# Patient Record
Sex: Male | Born: 2000 | Race: White | Hispanic: Yes | Marital: Single | Smoking: Never smoker
Health system: Southern US, Community
[De-identification: ages and names within clinical notes are randomized; demographics above are authoritative.]

---
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
## 2021-02-14 ENCOUNTER — Other Ambulatory Visit: Payer: Self-pay

## 2021-02-14 ENCOUNTER — Telehealth (INDEPENDENT_AMBULATORY_CARE_PROVIDER_SITE_OTHER): Payer: 59 | Admitting: Child and Adolescent Psychiatry

## 2021-02-14 DIAGNOSIS — F3341 Major depressive disorder, recurrent, in partial remission: Secondary | ICD-10-CM

## 2021-02-14 DIAGNOSIS — F418 Other specified anxiety disorders: Secondary | ICD-10-CM | POA: Diagnosis not present

## 2021-02-14 NOTE — Progress Notes (Signed)
Virtual Visit via Video Note  I connected with Adam Rodgers on 02/14/21 at  4:30 PM EST by a video enabled telemedicine application and verified that I am speaking with the correct person using two identifiers.  Location: Patient: home Provider: office   I discussed the limitations of evaluation and management by telemedicine and the availability of in person appointments. The patient expressed understanding and agreed to proceed.    I discussed the assessment and treatment plan with the patient. The patient was provided an opportunity to ask questions and all were answered. The patient agreed with the plan and demonstrated an understanding of the instructions.   The patient was advised to call back or seek an in-person evaluation if the symptoms worsen or if the condition fails to improve as anticipated.  I provided 17 minutes of non-face-to-face time during this encounter.   Orlene Erm, MD       [facility] MD/PA/NP OP Progress Note  02/14/2021 4:56 PM Adam Rodgers  MRN:  [id number]  Chief Complaint:   Medication management follow-up for ADHD, anxiety and depression.    HPI:  This is a 21 year old Hispanic male, 12th grader at [employer], domiciled with biological parents.  He has psychiatric history of ADHD, anxiety disorder, cannabis use disorder and depression.    He was seen and evaluated over telemedicine encounter for medication management follow-up.  He was accompanied with his mother at his home and was evaluated separately from his mother and jointly.  He reports that he is doing "fine".  He denies any problems with mood, denies feeling depressed.  Also denies problems with anxiety.  He reports that "everything is fine". He reports that he is doing well academically, has been very busy with the schoolwork and also continues to work at a [employer].  He reports that he is eating well, sleep has been good except recently.  He  reports that because of the Christmas break his sleep is out of routine and is trying to fix that.  He denies any suicidal thoughts or homicidal thoughts.  He initially reported that he has been compliant to his medications however when he was presented with the details that he has not picked up the prescription since August of last year he reports that he has not been taking it recently because he has been forgetting and focusing on his schoolwork.  He reports that he is planning to restart taking Zoloft 25 mg because it was helpful to him.  We discussed his hesitation with medication and he reports that he just forgets to take them.  We discussed the importance of adherence if he restarts taking Zoloft.  He verbalized understanding.  His mother denies any new concerns for today's appointment and reports that from her perspective Adam Rodgers has been doing really well.  Denies concerns regarding mood or anxiety.  She reports that he still struggles academically but has been more motivated to finish his high school on a good note.   Visit Diagnosis:    ICD-10-CM   1. Other specified anxiety disorders  F41.8     2. Recurrent major depressive disorder, in partial remission ([place])  F33.41         Past Psychiatric History: As mentioned in initial H&P, reviewed today, no change Past Medical History: No past medical history on file. No past surgical history on file.  Family Psychiatric History: As mentioned in initial H&P, reviewed today, no change   Family History:  No family history on file.  Social History:  Social History   Socioeconomic History   Marital status: Single    Spouse name: Not on file   Number of children: Not on file   Years of education: Not on file   Highest education level: Not on file  Occupational History   Not on file  Tobacco Use   Smoking status: Never   Smokeless tobacco: Never  Substance and Sexual Activity   Alcohol use: Not Currently   Drug use: Not  Currently   Sexual activity: Not on file  Other Topics Concern   Not on file  Social History Narrative   Not on file   Social Determinants of Health   Financial Resource Strain: Not on file  Food Insecurity: Not on file  Transportation Needs: Not on file  Physical Activity: Not on file  Stress: Not on file  Social Connections: Not on file    Allergies: No Known Allergies  Metabolic Disorder Labs: No results found for: HGBA1C, MPG No results found for: PROLACTIN Lab Results  Component Value Date   CHOL 168 09/26/2013   TRIG 76 09/26/2013   HDL 50 09/26/2013   VLDL 15 09/26/2013   LDLCALC 103 (H) 09/26/2013   LDLCALC 109 (H) 09/15/2011   No results found for: TSH  Therapeutic Level Labs: No results found for: LITHIUM No results found for: VALPROATE No components found for:  CBMZ  Current Medications: Current Outpatient Medications  Medication Sig Dispense Refill   hydrOXYzine (ATARAX/VISTARIL) 25 MG tablet Take 0.5-1 tablets (12.5-25 mg total) by mouth 3 (three) times daily as needed for anxiety. 30 tablet 0   sertraline (ZOLOFT) 25 MG tablet Take 1 tablet (25 mg total) by mouth daily. 30 tablet 1   traZODone (DESYREL) 50 MG tablet TAKE 1/2 TO 1 TABLET(25 TO 50 MG) BY MOUTH AT BEDTIME 90 tablet 0   No current facility-administered medications for this visit.     Musculoskeletal: Strength & Muscle Tone: unable to assess since visit was over the telemedicine. Gait & Station: unable to assess since visit was over the telemedicine. Patient leans: N/A  Psychiatric Specialty Exam: Review of Systems  There were no vitals taken for this visit.There is no height or weight on file to calculate BMI.   Mental Status Exam: Appearance: casually dressed; well groomed; no overt signs of trauma or distress noted Attitude: calm, cooperative with good eye contact Activity: No PMA/PMR, no tics/no tremors; no EPS noted  Speech: normal rate, rhythm and volume Thought Process:  Logical, linear, and goal-directed.  Associations: no looseness, tangentiality, circumstantiality, flight of ideas, thought blocking or word salad noted Thought Content: (abnormal/psychotic thoughts): no abnormal or delusional thought process evidenced SI/HI: denies Si/Hi Perception: no illusions or visual/auditory hallucinations noted; no response to internal stimuli demonstrated Mood & Affect: "good"/full range, neutral Judgment & Insight: both fair Attention and Concentration : Good Cognition : WNL Language : Good ADL - Intact   GAD-7    Flowsheet Row Counselor from 07/14/2019 in [facility]  Total GAD-7 Score 17      PHQ2-9    [place] from 07/14/2019 in [place]  PHQ-2 Total Score 6  PHQ-9 Total Score 19        Assessment and Plan:   21 year old male with hx of anxiety, ADHD,  Depression, Cannabis use disorder, presents today for follow up .  He continues to report overall stability with mood and anxiety despite nonadherence  to medication.  Some concerns regarding minimization of his symptoms.  Psychoeducation was provided in regards of medication treatment and he wants to restart taking Zoloft 25 mg once a day.  His mother denies any new concerns for today's appointment and denies concerns regarding mood or anxiety at this time.  Plan is mentioned below.    Plan: # Anxiety (chronic,stable) -Restart with Zoloft 25 mg once a day - Continue with Atarax 12.5-25 mg TID PRN for anxiety, has not been using it.  - He was previously given name of [email or web address] to look for therapist in community since they do not want to continue with Ms. Kandice Moos. He is not interested in therapy.    # Depression (recurrent and remission) - Same as mentioned above for anxiety   # ADHD (chronic and same) -Consider restarting Concerta if needed, however he is doing well with ADHD.    # Sleep(improved) - Not taking  Trazodone 25-50 mg QHS for sleep.    This note was generated in part or whole with voice recognition software. Voice recognition is usually quite accurate but there are transcription errors that can and very often do occur. I apologize for any typographical errors that were not detected and corrected.  MDM = 2 or more chronic stable conditions + med management        Orlene Erm, MD 02/14/2021, 4:56 PM

## 2021-02-17 IMAGING — US US SCROTUM W/ DOPPLER COMPLETE
1 series · 14 of 25 positions shown · non-contrast
Comparison: None.

CLINICAL DATA: Left testicular swelling for 2 days

EXAM:
SCROTAL ULTRASOUND
DOPPLER ULTRASOUND OF THE TESTICLES
TECHNIQUE: Complete ultrasound examination of the testicles, epididymis, and
other scrotal structures was performed. Color and spectral Doppler
ultrasound were also utilized to evaluate blood flow to the
testicles.

[Series 1: us scrotum w/doppler · 14 of 43 slices shown]
[im 1/43]
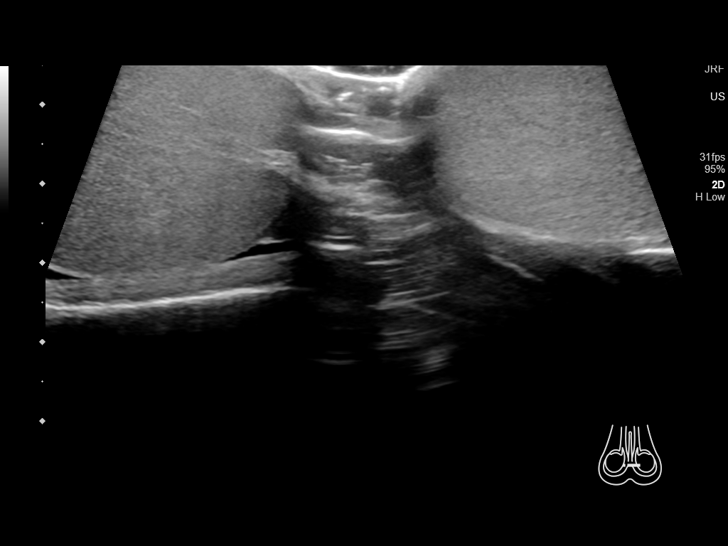
[im 4/43]
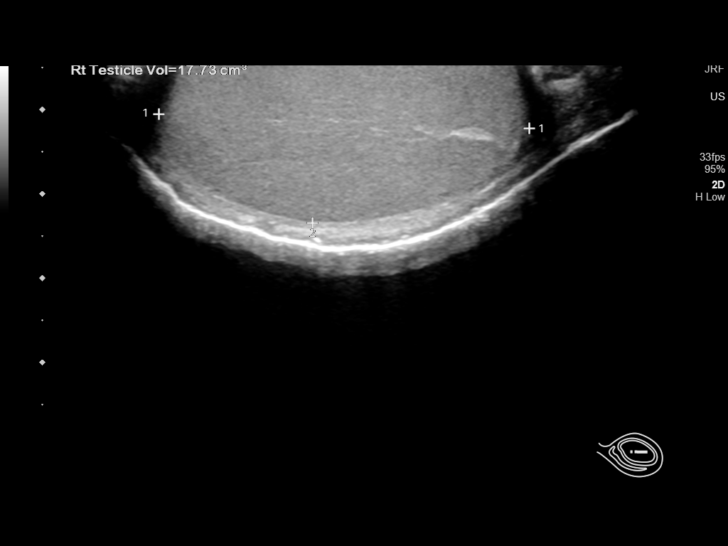
[im 8/43]
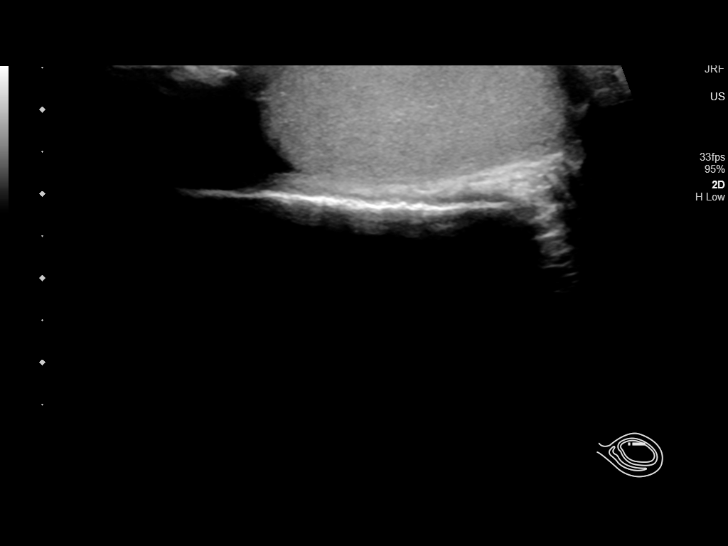
[im 11/43]
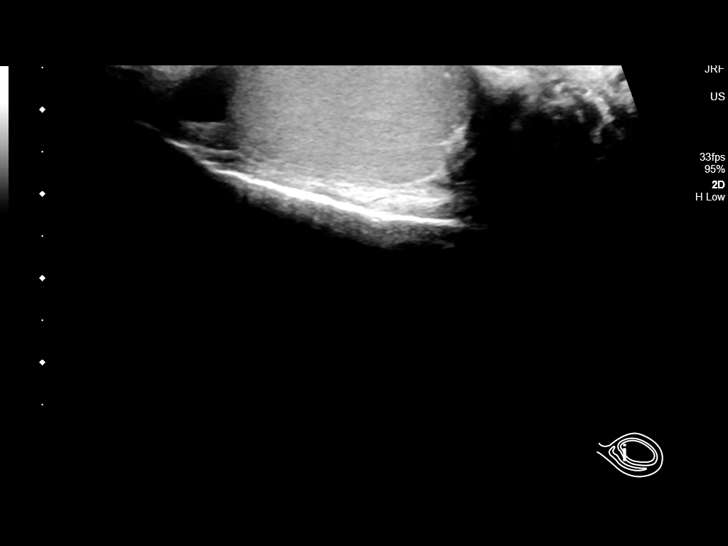
[im 15/43]
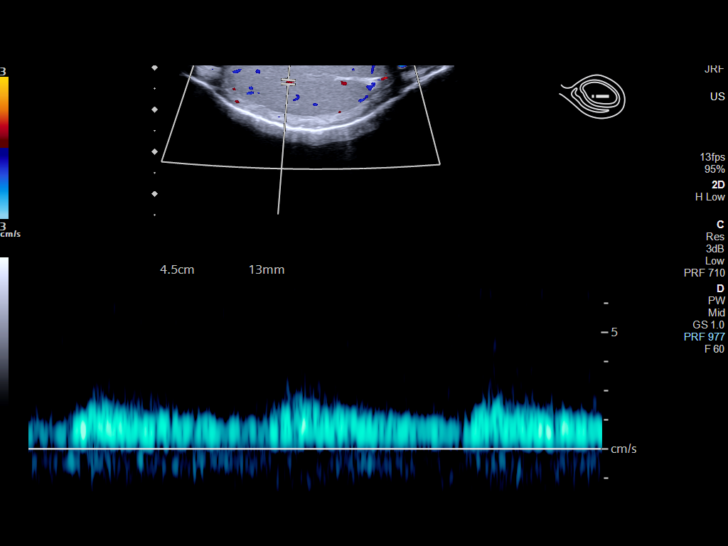
[im 16/43]
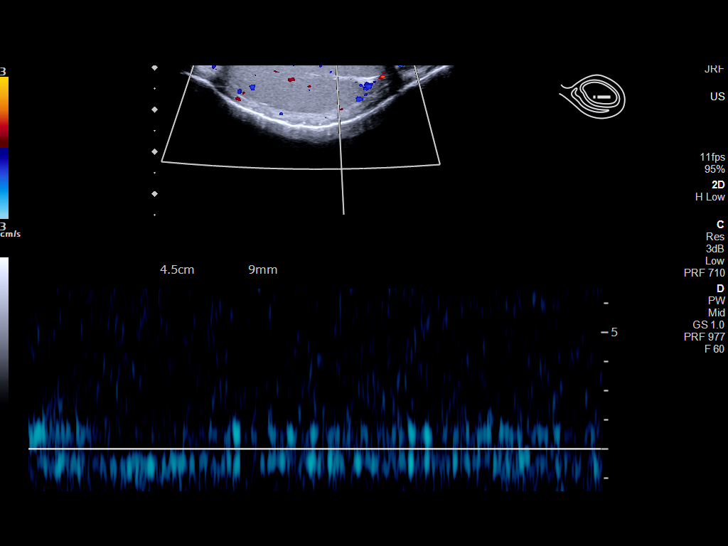
[im 20/43]
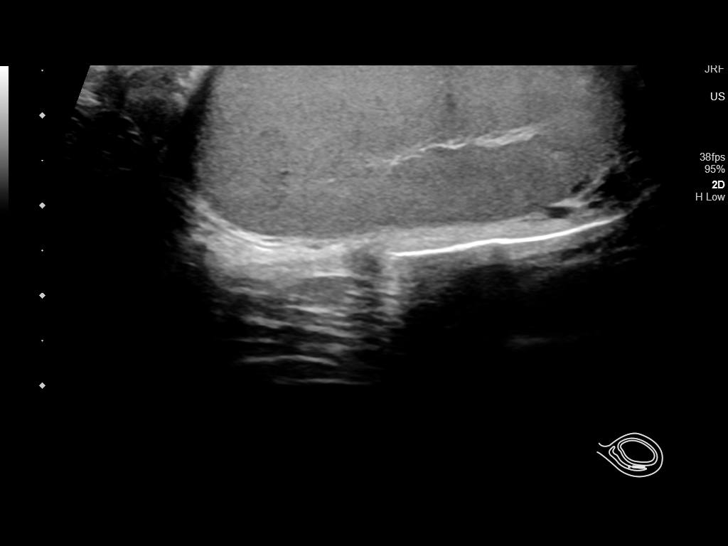
[im 23/43]
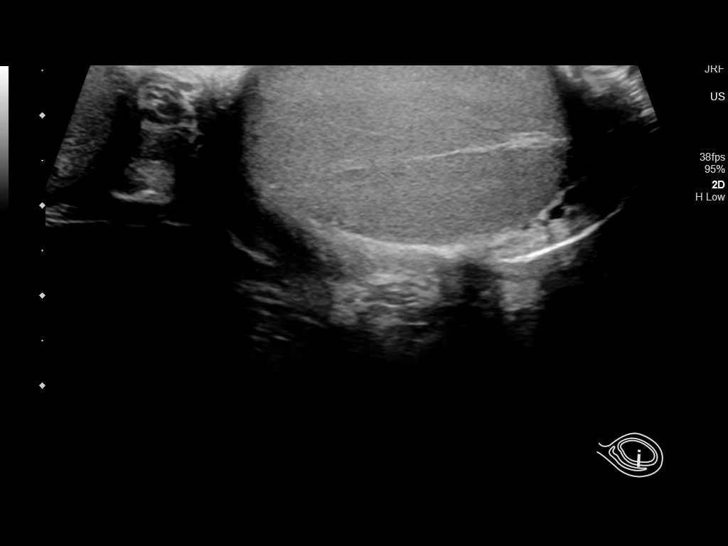
[im 27/43]
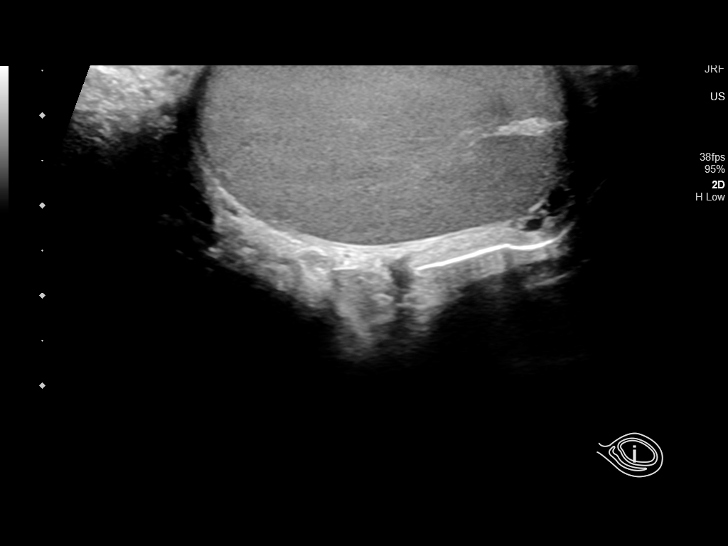
[im 29/43]
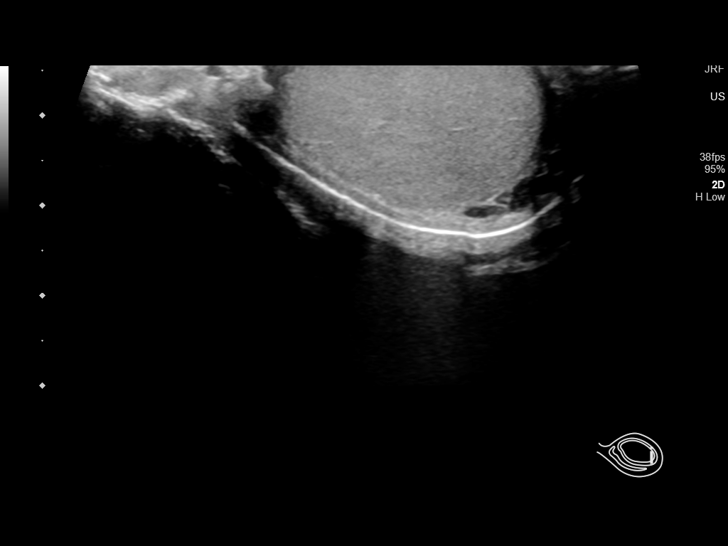
[im 32/43]
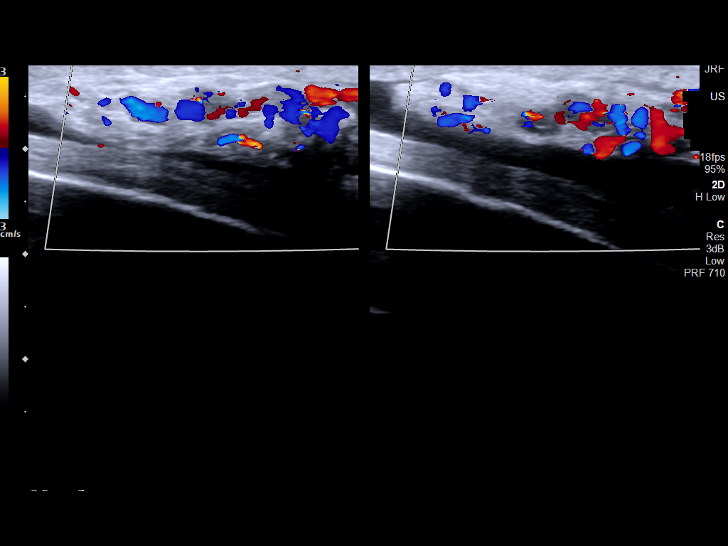
[im 36/43]
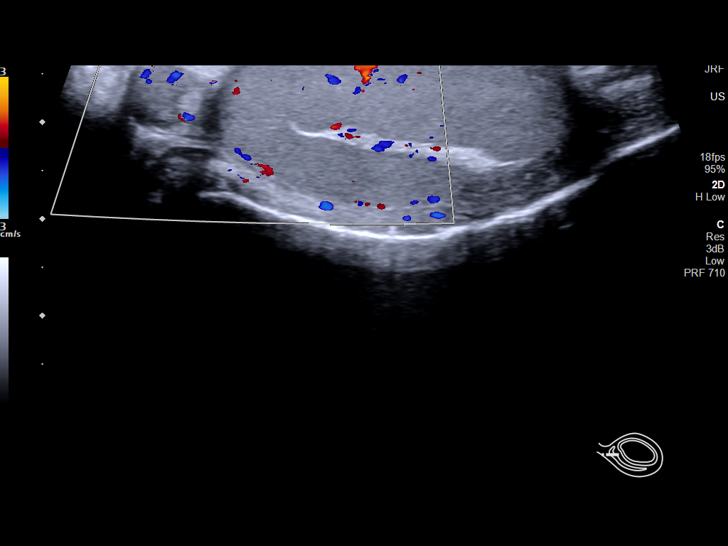
[im 39/43]
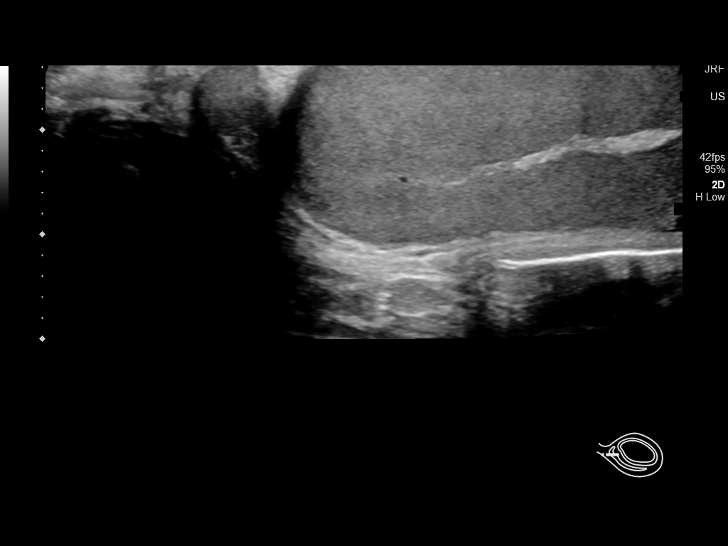
[im 43/43]
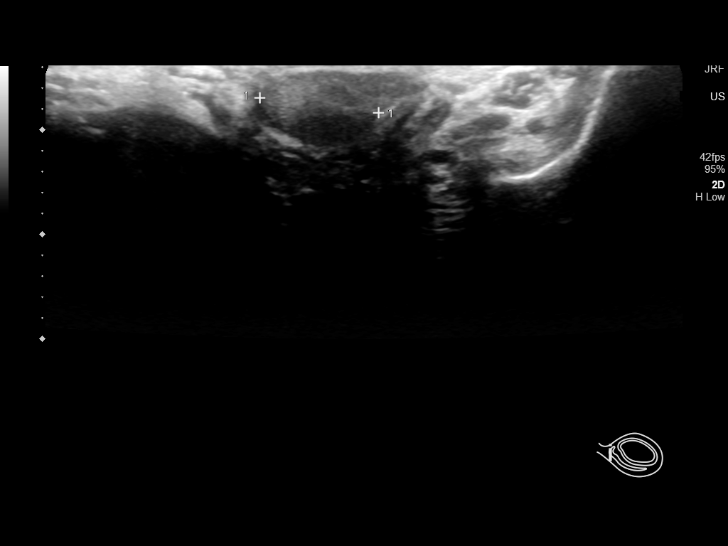

[14 of 25 positions shown; findings below may reference images not displayed]

FINDINGS: Right testicle

Measurements: 4.4 x 2.1 x 3.7 cm. No mass or microlithiasis
visualized.

Left testicle

Measurements: 5.3 x 2.4 x 3.6 cm. No mass or microlithiasis
visualized.

Right epididymis:  Normal in size and appearance.

Left epididymis:  Normal in size and appearance.

Hydrocele:  None visualized.

Varicocele:  None visualized.

Pulsed Doppler interrogation of both testes demonstrates normal low
resistance arterial and venous waveforms bilaterally.
IMPRESSION: 1. Unremarkable testicular ultrasound.

## 2021-03-20 ENCOUNTER — Other Ambulatory Visit: Payer: Self-pay | Admitting: Child and Adolescent Psychiatry

## 2021-03-20 DIAGNOSIS — F418 Other specified anxiety disorders: Secondary | ICD-10-CM

## 2021-03-24 ENCOUNTER — Telehealth: Payer: Self-pay

## 2021-03-24 NOTE — Telephone Encounter (Signed)
Medication refill - Pt's Mother called stating patient is in need of a new Concerta 36 mg order to be sent into their [facility] on [address] Patient's next appt. is scheduled for 04/05/21.

## 2021-03-25 MED ORDER — METHYLPHENIDATE HCL ER (OSM) 36 MG PO TBCR: 36.0000 mg | EXTENDED_RELEASE_TABLET | Freq: Every day | ORAL | 0 refills | Status: DC

## 2021-03-25 NOTE — Telephone Encounter (Signed)
I spoke with pt and he reports that he recently restarted taking Concerta as his mother wants him to be regular with his medications. He reports that Concerta is helpful with attention and he wishes to continue. I discussed that I will send rx for Concerta 36 mg daily, however he will have to do UDS within 30 days. He verbalized understanding and agreed to this.

## 2021-04-05 ENCOUNTER — Telehealth (INDEPENDENT_AMBULATORY_CARE_PROVIDER_SITE_OTHER): Payer: 59 | Admitting: Child and Adolescent Psychiatry

## 2021-04-05 ENCOUNTER — Other Ambulatory Visit: Payer: Self-pay

## 2021-04-05 ENCOUNTER — Telehealth: Payer: Self-pay

## 2021-04-05 DIAGNOSIS — F3341 Major depressive disorder, recurrent, in partial remission: Secondary | ICD-10-CM | POA: Diagnosis not present

## 2021-04-05 DIAGNOSIS — F418 Other specified anxiety disorders: Secondary | ICD-10-CM | POA: Diagnosis not present

## 2021-04-05 DIAGNOSIS — F902 Attention-deficit hyperactivity disorder, combined type: Secondary | ICD-10-CM

## 2021-04-05 MED ORDER — SERTRALINE HCL 25 MG PO TABS: 25.0000 mg | ORAL_TABLET | Freq: Every day | ORAL | 1 refills | Status: DC

## 2021-04-05 NOTE — Telephone Encounter (Signed)
labwork orders emailed to [email or web address]

## 2021-04-05 NOTE — Telephone Encounter (Signed)
thanks

## 2021-04-05 NOTE — Progress Notes (Signed)
Virtual Visit via Video Note  I connected with Adam Rodgers on 04/05/21 at  4:30 PM EST by a video enabled telemedicine application and verified that I am speaking with the correct person using two identifiers.  Location: Patient: home Provider: office   I discussed the limitations of evaluation and management by telemedicine and the availability of in person appointments. The patient expressed understanding and agreed to proceed.    I discussed the assessment and treatment plan with the patient. The patient was provided an opportunity to ask questions and all were answered. The patient agreed with the plan and demonstrated an understanding of the instructions.   The patient was advised to call back or seek an in-person evaluation if the symptoms worsen or if the condition fails to improve as anticipated.  I provided 20 minutes of non-face-to-face time during this encounter.   Adam Smalling, MD       [facility] MD/PA/NP OP Progress Note  04/05/2021 4:55 PM Adam Rodgers  MRN:  [id number]  Chief Complaint:   Medication management follow-up for ADHD, anxiety, depression.  HPI:  This is a 21 year old Hispanic male, 12th grader at [employer], domiciled with biological parents.  He has psychiatric history of ADHD, anxiety disorder, cannabis use disorder and depression.   He was seen and evaluated over telemedicine encounter for medication management follow-up.  He was accompanied with his mother at his home and was evaluated alone and with his verbal informed consent I spoke with his mother to obtain collateral information and discuss the treatment plan.  He reports that he has been doing "good".  He denies any low lows or depressed mood.  He denies irritability.  He reports that he continues to work, and enjoys his free time doing his preferred activities.  He reports that he has been sleeping fairly well, eating well, denies any suicidal thoughts or  homicidal thoughts.  In regards of anxiety he reports that he is doing much better.  He reports that he has been feeling much more comfortable driving.  He reports that previously he thought that he would never be able to drive but he has been doing so well with that.  He reports that overall his anxiety is stable and manageable.  He reports that he has been taking Zoloft 25 mg every day and denies any problems with it.  He reports that hydroxyzine he had to take only once during the last month.  In regards of ADHD, he called in the interim since last appointment and reported that he would like to try Concerta again to help with his attention.  He reports that he took Concerta from old leftover which helped him with the schoolwork and therefore he called to get the refill.  Writer agreed to start him with Concerta with condition that he will do urine drug screen before his next refill.  He reports that he did not know that prescription was sent for him and therefore he has not restarted taking Concerta.  He reports that he would like to try and see if that would help him pay attention more and do well in school.  He agrees to do a urine drug screen before the next refill.  He denies any substance abuse at this time.  His mother denies any concerns for today's appointment and reports that [name] has continued to do well.  Denies concerns regarding mood or anxiety.  They will follow back again in a month or earlier  if needed.    Visit Diagnosis:    ICD-10-CM   1. Attention deficit hyperactivity disorder (ADHD), combined type  F90.2     2. Other specified anxiety disorders  F41.8 sertraline (ZOLOFT) 25 MG tablet    3. Recurrent major depressive disorder, in partial remission (HCC)  F33.41         Past Psychiatric History: As mentioned in initial H&P, reviewed today, no change Past Medical History: No past medical history on file. No past surgical history on file.  Family Psychiatric  History: As mentioned in initial H&P, reviewed today, no change   Family History: No family history on file.  Social History:  Social History   Socioeconomic History   Marital status: Single    Spouse name: Not on file   Number of children: Not on file   Years of education: Not on file   Highest education level: Not on file  Occupational History   Not on file  Tobacco Use   Smoking status: Never   Smokeless tobacco: Never  Substance and Sexual Activity   Alcohol use: Not Currently   Drug use: Not Currently   Sexual activity: Not on file  Other Topics Concern   Not on file  Social History Narrative   Not on file   Social Determinants of Health   Financial Resource Strain: Not on file  Food Insecurity: Not on file  Transportation Needs: Not on file  Physical Activity: Not on file  Stress: Not on file  Social Connections: Not on file    Allergies: No Known Allergies  Metabolic Disorder Labs: No results found for: HGBA1C, MPG No results found for: PROLACTIN Lab Results  Component Value Date   CHOL 168 09/26/2013   TRIG 76 09/26/2013   HDL 50 09/26/2013   VLDL 15 09/26/2013   LDLCALC 103 (H) 09/26/2013   LDLCALC 109 (H) 09/15/2011   No results found for: TSH  Therapeutic Level Labs: No results found for: LITHIUM No results found for: VALPROATE No components found for:  CBMZ  Current Medications: Current Outpatient Medications  Medication Sig Dispense Refill   hydrOXYzine (ATARAX/VISTARIL) 25 MG tablet Take 0.5-1 tablets (12.5-25 mg total) by mouth 3 (three) times daily as needed for anxiety. 30 tablet 0   methylphenidate (CONCERTA) 36 MG PO CR tablet Take 1 tablet (36 mg total) by mouth daily. 30 tablet 0   sertraline (ZOLOFT) 25 MG tablet Take 1 tablet (25 mg total) by mouth daily. 30 tablet 1   No current facility-administered medications for this visit.     Musculoskeletal: Strength & Muscle Tone: unable to assess since visit was over the  telemedicine. Gait & Station: unable to assess since visit was over the telemedicine. Patient leans: N/A  Psychiatric Specialty Exam: Review of Systems  There were no vitals taken for this visit.There is no height or weight on file to calculate BMI.   Mental Status Exam: Appearance: casually dressed; no overt signs of trauma or distress noted Attitude: calm, cooperative with good eye contact Activity: No PMA/PMR, no tics/no tremors; no EPS noted  Speech: normal rate, rhythm and volume Thought Process: Logical, linear, and goal-directed.  Associations: no looseness, tangentiality, circumstantiality, flight of ideas, thought blocking or word salad noted Thought Content: (abnormal/psychotic thoughts): no abnormal or delusional thought process evidenced SI/HI: denies Si/Hi Perception: no illusions or visual/auditory hallucinations noted; no response to internal stimuli demonstrated Mood & Affect: "good"/full range, neutral Judgment & Insight: both fair Attention and Concentration : Good  Cognition : WNL Language : Good ADL - Intact   GAD-7    Flowsheet Row Counselor from 07/14/2019 in [facility]  Total GAD-7 Score 17      PHQ2-9    Flowsheet Row Counselor from 07/14/2019 in [facility]  PHQ-2 Total Score 6  PHQ-9 Total Score 19        Assessment and Plan:   21 year old male with hx of anxiety, ADHD,  Depression, Cannabis use disorder, presents today for follow up .  He continues to report overall stability with mood and anxiety, taking Zoloft 25 mg once a day consistently. Restarting Concerta for ADHD. His mother denies any new concerns for today's appointment and denies concerns regarding mood or anxiety at this time.  Plan is mentioned below.    Plan: # Anxiety (chronic,stable) - Continue with Zoloft 25 mg once a day - Continue with Atarax 12.5-25 mg TID PRN for anxiety, has not been using it.  - He was previously  given name of [email or web address] to look for therapist in community since they do not want to continue with Ms. Langley Gauss. He is not interested in therapy.    # Depression (recurrent and remission) - Same as mentioned above for anxiety   # ADHD (chronic and same) -Restart Concerta 36 mg daily.    # Sleep(improved) - Not taking Trazodone 25-50 mg QHS for sleep.    This note was generated in part or whole with voice recognition software. Voice recognition is usually quite accurate but there are transcription errors that can and very often do occur. I apologize for any typographical errors that were not detected and corrected.  MDM = 2 or more chronic stable conditions + med management        Adam Smalling, MD 04/05/2021, 4:55 PM

## 2021-05-03 ENCOUNTER — Telehealth: Payer: Self-pay

## 2021-05-03 NOTE — Telephone Encounter (Signed)
Ok, he has an appointment on 03/30 and will discuss during the appointment. Please let her know. Thanks

## 2021-05-03 NOTE — Telephone Encounter (Signed)
pt mother called left a message that the methylphenidate is out and she can not find anywhere she wants you to try something else.  ?

## 2021-05-05 ENCOUNTER — Telehealth (INDEPENDENT_AMBULATORY_CARE_PROVIDER_SITE_OTHER): Payer: 59 | Admitting: Child and Adolescent Psychiatry

## 2021-05-05 DIAGNOSIS — F902 Attention-deficit hyperactivity disorder, combined type: Secondary | ICD-10-CM

## 2021-05-05 DIAGNOSIS — F3341 Major depressive disorder, recurrent, in partial remission: Secondary | ICD-10-CM | POA: Diagnosis not present

## 2021-05-05 DIAGNOSIS — F418 Other specified anxiety disorders: Secondary | ICD-10-CM | POA: Diagnosis not present

## 2021-05-05 NOTE — Progress Notes (Signed)
Virtual Visit via Video Note ? ?I connected with Adam Rodgers on 05/05/21 at  4:30 PM EDT by a video enabled telemedicine application and verified that I am speaking with the correct person using two identifiers. ? ?Location: ?Patient: home ?Provider: office ?  ?I discussed the limitations of evaluation and management by telemedicine and the availability of in person appointments. The patient expressed understanding and agreed to proceed. ? ?  ?I discussed the assessment and treatment plan with the patient. The patient was provided an opportunity to ask questions and all were answered. The patient agreed with the plan and demonstrated an understanding of the instructions. ?  ?The patient was advised to call back or seek an in-person evaluation if the symptoms worsen or if the condition fails to improve as anticipated. ? ?I provided 20 minutes of non-face-to-face time during this encounter. ? ? ?Adam SmallingHiren M Shadara Lopez, MD ? ? ? ? ? ? ?BH MD/PA/NP OP Progress Note ? ?05/05/2021 5:01 PM ?Adam MangoNicolas Naveiro Rodgers  ?MRN:  [id number] ? ?Chief Complaint:   Medication management follow-up for anxiety, ADHD, depression. ? ?HPI: ? ?This is a 21 year old Hispanic male, 12th grader at [employer], domiciled with biological parents.  He has psychiatric history of ADHD, anxiety disorder, cannabis use disorder and depression.  ? ?He was seen and evaluated over telemedicine encounter for medication management follow-up.  He was accompanied with his mother at his home and was evaluated alone and with his verbal informed consent I spoke with his mother to obtain collateral information and discuss the treatment plan. ? ?He reports that he has continued to do well.  He denies any new concerns for today's appointment.  He reports that he has not been taking any of his medications because he does not feel the need to take them.  He reports that he has been doing well with his mood, he has not been feeling excessively worried  or anxious.  He reports that previously driving was a big anxiety situation for him but he has been driving by himself not having any problems with anxiety with that.  He also reports that he is doing well in school, brought his grades up and caught up with all his schoolwork.  He reports that they could not fill Concerta because of shortage on it and believes that he does not need Concerta because he has been able to pay attention well with his schoolwork.  He reports that he sleeps well, he is tired on most days but because of the schoolwork and work outside of the school.  He denies any suicidal thoughts or homicidal thoughts.  He denies any new psychosocial stressors.  ? ?His mother denies any new concerns for today's appointment and reports that he has been doing well in regards of mood and anxiety and doing well in school.   ? ?Adam Rodgers has not been taking medication for a while now, did try taking it intermittently but off of it for a few weeks now and still doing well.  He does not want to be on medication.  I discussed with both Adam Rodgers and his mom that since he has not been consistently taking medications and reporting stability with mood and anxiety, and does not want to restart them therefore we mutually decided to hold off of medications for now and continue to monitor.  They verbalized understanding and agreed with this plan.  They will follow back and 2 months for a check-in or earlier if needed. ? ? ?  Visit Diagnosis:  ?  ICD-10-CM   ?1. Attention deficit hyperactivity disorder (ADHD), combined type  F90.2   ?  ?2. Recurrent major depressive disorder, in partial remission (HCC)  F33.41   ?  ?3. Other specified anxiety disorders  F41.8   ?  ? ? ? ? ? ?Past Psychiatric History: As mentioned in initial H&P, reviewed today, no change ?Past Medical History: No past medical history on file. No past surgical history on file. ? ?Family Psychiatric History: As mentioned in initial H&P, reviewed today, no  change ? ? ?Family History: No family history on file. ? ?Social History:  ?Social History  ? ?Socioeconomic History  ? Marital status: Single  ?  Spouse name: Not on file  ? Number of children: Not on file  ? Years of education: Not on file  ? Highest education level: Not on file  ?Occupational History  ? Not on file  ?Tobacco Use  ? Smoking status: Never  ? Smokeless tobacco: Never  ?Substance and Sexual Activity  ? Alcohol use: Not Currently  ? Drug use: Not Currently  ? Sexual activity: Not on file  ?Other Topics Concern  ? Not on file  ?Social History Narrative  ? Not on file  ? ?Social Determinants of Health  ? ?Financial Resource Strain: Not on file  ?Food Insecurity: Not on file  ?Transportation Needs: Not on file  ?Physical Activity: Not on file  ?Stress: Not on file  ?Social Connections: Not on file  ? ? ?Allergies: No Known Allergies ? ?Metabolic Disorder Labs: ?No results found for: HGBA1C, MPG ?No results found for: PROLACTIN ?Lab Results  ?Component Value Date  ? CHOL 168 09/26/2013  ? TRIG 76 09/26/2013  ? HDL 50 09/26/2013  ? VLDL 15 09/26/2013  ? LDLCALC 103 (H) 09/26/2013  ? LDLCALC 109 (H) 09/15/2011  ? ?No results found for: TSH ? ?Therapeutic Level Labs: ?No results found for: LITHIUM ?No results found for: VALPROATE ?No components found for:  CBMZ ? ?Current Medications: ?Current Outpatient Medications  ?Medication Sig Dispense Refill  ? hydrOXYzine (ATARAX/VISTARIL) 25 MG tablet Take 0.5-1 tablets (12.5-25 mg total) by mouth 3 (three) times daily as needed for anxiety. 30 tablet 0  ? ?No current facility-administered medications for this visit.  ? ? ? ?Musculoskeletal: ?Strength & Muscle Tone: unable to assess since visit was over the telemedicine. ?Gait & Station: unable to assess since visit was over the telemedicine. ?Patient leans: N/A ? ?Psychiatric Specialty Exam: ?Review of Systems  ?There were no vitals taken for this visit.There is no height or weight on file to calculate BMI.   ? ?Mental Status Exam: ?Appearance: casually dressed; well groomed; no overt signs of trauma or distress noted ?Attitude: calm, cooperative with good eye contact ?Activity: No PMA/PMR, no tics/no tremors; no EPS noted  ?Speech: normal rate, rhythm and volume ?Thought Process: Logical, linear, and goal-directed.  ?Associations: no looseness, tangentiality, circumstantiality, flight of ideas, thought blocking or word salad noted ?Thought Content: (abnormal/psychotic thoughts): no abnormal or delusional thought process evidenced ?SI/HI: denies Si/Hi ?Perception: no illusions or visual/auditory hallucinations noted; no response to internal stimuli demonstrated ?Mood & Affect: "good"/full range, neutral ?Judgment & Insight: both fair ?Attention and Concentration : Good ?Cognition : WNL ?Language : Good ?ADL - Intact  ? ?GAD-7   ? ?Flowsheet Row Counselor from 07/14/2019 in [facility]  ?Total GAD-7 Score 17  ? ?  ? ?PHQ2-9   ? ?Flowsheet Row Counselor from 07/14/2019 in [facility]  ?PHQ-2 Total Score 6  ?PHQ-9 Total Score 19  ? ?  ? ? ? ?Assessment and Plan:  ? ?21 year old male with hx of anxiety, ADHD,  Depression, Cannabis use disorder, presents today for follow up .  He continues to report overall stability with mood and anxiety, has again stopped taking Zoloft 25 mg once a day and despite taht he and his mother reports improvement. Also doing well with inattention despite not being on medications. We mutually decided to hold off on medications at this time. Plan is mentioned below.  ?  ?Plan: ?# Anxiety (chronic,stable) ?- Self discontinued Zoloft 25 mg once a day ?- Continue with Atarax 12.5-25 mg TID PRN for anxiety, has not been using it.  ?- He was previously given name of [email or web address] to look for therapist in community since they do not want to continue with Ms. Langley Gauss. He is not interested in therapy.  ?  ?# Depression (recurrent and remission) ?-  Same as mentioned above for anxiety ?  ?# ADHD (chronic and same) ?-Continue to monitor   ? ?# Sleep(improved) ? ? ?This note was generated in part or whole with voice recognition software. Voice recognition is

## 2021-07-12 ENCOUNTER — Encounter: Payer: Self-pay | Admitting: Child and Adolescent Psychiatry

## 2021-07-12 ENCOUNTER — Telehealth (INDEPENDENT_AMBULATORY_CARE_PROVIDER_SITE_OTHER): Payer: 59 | Admitting: Child and Adolescent Psychiatry

## 2021-07-12 DIAGNOSIS — F411 Generalized anxiety disorder: Secondary | ICD-10-CM

## 2021-07-12 DIAGNOSIS — F3341 Major depressive disorder, recurrent, in partial remission: Secondary | ICD-10-CM | POA: Diagnosis not present

## 2021-07-12 MED ORDER — SERTRALINE HCL 25 MG PO TABS: 25.0000 mg | ORAL_TABLET | Freq: Every day | ORAL | 1 refills | Status: DC

## 2021-07-12 MED ORDER — HYDROXYZINE HCL 25 MG PO TABS: 12.5000 mg | ORAL_TABLET | Freq: Three times a day (TID) | ORAL | 0 refills | Status: DC | PRN

## 2021-07-12 NOTE — Progress Notes (Signed)
Virtual Visit via Video Note  I connected with Adam Rodgers on 07/12/21 at  4:30 PM EDT by a video enabled telemedicine application and verified that I am speaking with the correct person using two identifiers.  Location: Patient: home Provider: office   I discussed the limitations of evaluation and management by telemedicine and the availability of in person appointments. The patient expressed understanding and agreed to proceed.    I discussed the assessment and treatment plan with the patient. The patient was provided an opportunity to ask questions and all were answered. The patient agreed with the plan and demonstrated an understanding of the instructions.   The patient was advised to call back or seek an in-person evaluation if the symptoms worsen or if the condition fails to improve as anticipated.  I provided 20 minutes of non-face-to-face time during this encounter.   Adam SmallingHiren M Tocara Mennen, MD       [facility] MD/PA/NP OP Progress Note  07/12/2021 4:54 PM Adam Rodgers  MRN:  [id number]  Chief Complaint:   Medication management follow-up for anxiety and depression.  HPI:  This is a 21 year old Hispanic male, 12th grader at [employer], domiciled with biological parents.  He has psychiatric history of ADHD, anxiety disorder, cannabis use disorder and depression.   He was seen and evaluated over telemedicine encounter for medication management follow-up.  He was accompanied with his mother at his home and was evaluated old and with his verbal informed consent I spoke with his mother to obtain collateral information and discuss her treatment plan.  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  He reports that he has been doing "pretty good" in regards of his mood however because of exams, school graduation and recently 2 of his  friend died, 1 in a car accident and the other on a drug overdose.  He reports that since then he has been feeling much more anxious, much more anxiety while driving the car, constantly worried when he is driving.  He also reports anxiety in general in the context of upcoming change to [facility] [employer] and other responsibilities as a young adult.  He denies any low lows or depressed mood, denies anhedonia, denies problems with sleep appetite or energy, denies any SI or HI.  He would like to go back on Zoloft for his anxiety.  We discussed to restart Zoloft at 25 mg once a day and take hydroxyzine as needed for anxiety.  Discussed side effects including but not limited to black box warning associated with Zoloft.  He did not have any side effects previously when he was taking it.  We discussed the importance of medication adherence.  I also suggested him to get a therapist and he agreed to this.  His mother denies any new concerns or questions for today's appointment.  I discussed the treatment plan for restarting Zoloft and hydroxyzine and she agreed to help him find a therapist.  They will follow back again in a month or earlier if needed.   Visit Diagnosis:    ICD-10-CM   1. Generalized anxiety disorder  F41.1 sertraline (ZOLOFT) 25 MG tablet    hydrOXYzine (ATARAX) 25 MG tablet    2. Recurrent major depressive disorder, in partial remission (HCC)  F33.41 sertraline (ZOLOFT) 25 MG tablet         Past Psychiatric  History: As mentioned in initial H&P, reviewed today, no change Past Medical History:  Past Medical History:  Diagnosis Date   Other specified anxiety disorders 11/14/2019   No past surgical history on file.  Family Psychiatric History: As mentioned in initial H&P, reviewed today, no change   Family History: No family history on file.  Social History:  Social History   Socioeconomic History   Marital status: Single    Spouse name: Not on file   Number of children: Not on file   Years  of education: Not on file   Highest education level: Not on file  Occupational History   Not on file  Tobacco Use   Smoking status: Never   Smokeless tobacco: Never  Substance and Sexual Activity   Alcohol use: Not Currently   Drug use: Not Currently   Sexual activity: Not on file  Other Topics Concern   Not on file  Social History Narrative   Not on file   Social Determinants of Health   Financial Resource Strain: Not on file  Food Insecurity: Not on file  Transportation Needs: Not on file  Physical Activity: Not on file  Stress: Not on file  Social Connections: Not on file    Allergies: No Known Allergies  Metabolic Disorder Labs: No results found for: HGBA1C, MPG No results found for: PROLACTIN Lab Results  Component Value Date   CHOL 168 09/26/2013   TRIG 76 09/26/2013   HDL 50 09/26/2013   VLDL 15 09/26/2013   LDLCALC 103 (H) 09/26/2013   LDLCALC 109 (H) 09/15/2011   No results found for: TSH  Therapeutic Level Labs: No results found for: LITHIUM No results found for: VALPROATE No components found for:  CBMZ  Current Medications: Current Outpatient Medications  Medication Sig Dispense Refill   sertraline (ZOLOFT) 25 MG tablet Take 1 tablet (25 mg total) by mouth daily. 30 tablet 1   hydrOXYzine (ATARAX) 25 MG tablet Take 0.5-1 tablets (12.5-25 mg total) by mouth 3 (three) times daily as needed for anxiety. 30 tablet 0   No current facility-administered medications for this visit.     Musculoskeletal: Strength & Muscle Tone: unable to assess since visit was over the telemedicine. Gait & Station: unable to assess since visit was over the telemedicine. Patient leans: N/A  Psychiatric Specialty Exam: Review of Systems  There were no vitals taken for this visit.There is no height or weight on file to calculate BMI.   Mental Status Exam: Appearance: casually dressed; well groomed; no overt signs of trauma or distress noted Attitude: calm, cooperative  with good eye contact Activity: No PMA/PMR, no tics/no tremors; no EPS noted  Speech: normal rate, rhythm and volume Thought Process: Logical, linear, and goal-directed.  Associations: no looseness, tangentiality, circumstantiality, flight of ideas, thought blocking or word salad noted Thought Content: (abnormal/psychotic thoughts): no abnormal or delusional thought process evidenced SI/HI: denies Si/Hi Perception: no illusions or visual/auditory hallucinations noted; no response to internal stimuli demonstrated Mood & Affect: "good"/full range, somewhat anxious.  Judgment & Insight: both fair Attention and Concentration : Good Cognition : WNL Language : Good ADL - Intact   GAD-7    Flowsheet Row Counselor from 07/14/2019 in [facility]  Total GAD-7 Score 17      PHQ2-9    Flowsheet Row Counselor from 07/14/2019 in [facility]  PHQ-2 Total Score 6  PHQ-9 Total Score 19        Assessment and Plan:  21 year old male with hx of anxiety, ADHD,  Depression, Cannabis use disorder, presents today for follow up .  He appears to have worsening of anxiety in the context of recent psychosocial stressors, anxiety seems to be distressing enough and impacting his functioning such as driving, would like to go back on Zoloft and therefore restarting add Zoloft 25 mg once a day.  He will take hydroxyzine as needed.  Patient appears to remain in remission.     Plan: # Anxiety (chronic,unstable) -Restart Zoloft 25 mg once a day - Continue with Atarax 12.5-25 mg TID PRN for anxiety, has not been using it.  - He was previously given name of [email or web address] to look for therapist in community since they do not want to continue with Ms. Langley Gauss.  He and his mother agrees to look for a therapist.   # Depression (recurrent and remission) - Same as mentioned above for anxiety   # ADHD (chronic and same) -Continue to monitor    #  Sleep(improved)   This note was generated in part or whole with voice recognition software. Voice recognition is usually quite accurate but there are transcription errors that can and very often do occur. I apologize for any typographical errors that were not detected and corrected.  MDM = 2 or more chronic  conditions + med management          Adam Smalling, MD 07/12/2021, 4:54 PM

## 2023-12-30 DIAGNOSIS — Z8249 Family history of ischemic heart disease and other diseases of the circulatory system: Secondary | ICD-10-CM | POA: Insufficient documentation

## 2023-12-30 NOTE — Progress Notes (Unsigned)
 Cardiology Office Note  Date:  12/31/2023   ID:  Adam Rodgers, Adam Rodgers 2000/12/03, MRN [id number]  PCP:  Vernona Bruckner, MD   Chief Complaint  Patient presents with   New Patient (Initial Visit)    Self referral for premature family history of CAD.  Patient c/o shortness of breath after running at rest.     HPI:  Adam Rodgers is a 23 y.o. male with past medical history of: Past Medical History:  Diagnosis Date   Other specified anxiety disorders 11/14/2019  Who presents by referral from Dr. Bruckner Vernona, Dr. Glover for family history of heart disease  full time student in business administration at [employer] and is also working full time at [employer], [employer].   goes to the gym for 1 hour or so 2 times a week; does weights mostly and a little cardio. He has a few drinks 1-2 times a week. Smokes weed, e-cigarettes, trying to cut back  Cholesterol well-controlled on no medication total cholesterol less than 150 LDL less than 70  Family history Sudden death (unexpected death due to unknown cause)  Maternal Uncle  Myocardial Infarction (Heart attack)  Heart failure Maternal Grandfather  Grandfather: died 72 MI Uncle died 34 MI  EKG personally reviewed by myself on todays visit EKG Interpretation Date/Time:  Monday December 31 2023 10:57:40 EST Ventricular Rate:  57 PR Interval:  146 QRS Duration:  98 QT Interval:  406 QTC Calculation: 395 R Axis:   67  Text Interpretation: Sinus bradycardia When compared with ECG of 18-Nov-2013 15:39, No significant change was found Confirmed by Perla Lye [phone]) on 12/31/2023 11:09:52 AM     PMH:   has a past medical history of Other specified anxiety disorders (11/14/2019).   PSH:   History reviewed. No pertinent surgical history.  Current Outpatient Medications  Medication Sig Dispense Refill   hydrOXYzine  (ATARAX ) 25 MG tablet Take 0.5-1 tablets (12.5-25 mg total) by mouth 3 (three) times daily  as needed for anxiety. (Patient not taking: Reported on 12/31/2023) 30 tablet 0   sertraline  (ZOLOFT ) 25 MG tablet Take 1 tablet (25 mg total) by mouth daily. (Patient not taking: Reported on 12/31/2023) 30 tablet 1   No current facility-administered medications for this visit.     Allergies:   Anesthetics, amide and Succinylcholine   Social History:  The patient  reports that he has never smoked. His smokeless tobacco use includes chew. He reports current alcohol use of about 2.0 standard drinks of alcohol per week. He reports current drug use. Drug: Marijuana.   Family History:   family history includes Heart attack (age of onset: 70) in his maternal uncle; Heart attack (age of onset: 81) in his maternal grandfather; Hypertension in his father.    Review of Systems: Review of Systems  Constitutional: Negative.   HENT: Negative.    Respiratory: Negative.    Cardiovascular: Negative.   Gastrointestinal: Negative.   Musculoskeletal: Negative.   Neurological: Negative.   Psychiatric/Behavioral: Negative.    All other systems reviewed and are negative.    PHYSICAL EXAM: VS:  BP 114/80 (BP Location: Left Arm, Patient Position: Sitting, Cuff Size: Normal)   Pulse (!) 57   Ht 6' 2 (1.88 m)   Wt 174 lb (78.9 kg)   SpO2 98%   BMI 22.34 kg/m  , BMI Body mass index is 22.34 kg/m. GEN: Well nourished, well developed, in no acute distress HEENT: normal Neck: no JVD, carotid bruits, or masses Cardiac: RRR;  no murmurs, rubs, or gallops,no edema  Respiratory:  clear to auscultation bilaterally, normal work of breathing GI: soft, nontender, nondistended, + BS MS: no deformity or atrophy Skin: warm and dry, no rash Neuro:  Strength and sensation are intact Psych: euthymic mood, full affect    Recent Labs: No results found for requested labs within last 365 days.    Lipid Panel Lab Results  Component Value Date   CHOL 168 09/26/2013   HDL 50 09/26/2013   LDLCALC 103 (H)  09/26/2013   TRIG 76 09/26/2013      Wt Readings from Last 3 Encounters:  12/31/23 174 lb (78.9 kg)  06/12/19 181 lb (82.1 kg) (88%, Z= 1.18)*   * Growth percentiles are based on CDC (Boys, 2-20 Years) data.       ASSESSMENT AND PLAN:  Problem List Items Addressed This Visit     Family history of heart disease - Primary   Relevant Orders   EKG 12-Lead (Completed)   Family history heart disease Discussed family history of heart disease on his mother side, less so on his father side His cholesterol is well-controlled, no diabetes, normal EKG Smokes a small amount weed and e-cigarettes, he is trying to cut back Asymptomatic, normal EKG No strong indication for testing at this time At a later age could consider calcium scoring for risk stratification but currently no need  Smoker Cessation recommended    Signed, Velinda Lunger, M.D., Ph.D. [facility] [place], [place] [phone]

## 2023-12-31 ENCOUNTER — Ambulatory Visit: Attending: Cardiovascular Disease | Admitting: Cardiovascular Disease

## 2023-12-31 ENCOUNTER — Encounter: Payer: Self-pay | Admitting: Cardiovascular Disease

## 2023-12-31 VITALS — BP 114/80 | HR 57 | Ht 74.0 in | Wt 174.0 lb

## 2023-12-31 DIAGNOSIS — Z8249 Family history of ischemic heart disease and other diseases of the circulatory system: Secondary | ICD-10-CM

## 2023-12-31 DIAGNOSIS — R0602 Shortness of breath: Secondary | ICD-10-CM | POA: Diagnosis not present

## 2023-12-31 NOTE — Patient Instructions (Addendum)
